# Patient Record
Sex: Female | Born: 1975 | Race: White | Hispanic: No | Smoking: Never smoker
Health system: Southern US, Community
[De-identification: ages and names within clinical notes are randomized; demographics above are authoritative.]

## PROBLEM LIST (undated history)

## (undated) DIAGNOSIS — F329 Major depressive disorder, single episode, unspecified: Secondary | ICD-10-CM

## (undated) DIAGNOSIS — J45909 Unspecified asthma, uncomplicated: Secondary | ICD-10-CM

## (undated) DIAGNOSIS — E282 Polycystic ovarian syndrome: Secondary | ICD-10-CM

## (undated) DIAGNOSIS — F32A Depression, unspecified: Secondary | ICD-10-CM

## (undated) DIAGNOSIS — M1990 Unspecified osteoarthritis, unspecified site: Secondary | ICD-10-CM

## (undated) DIAGNOSIS — G43909 Migraine, unspecified, not intractable, without status migrainosus: Secondary | ICD-10-CM

## (undated) DIAGNOSIS — K219 Gastro-esophageal reflux disease without esophagitis: Secondary | ICD-10-CM

## (undated) HISTORY — DX: Unspecified asthma, uncomplicated: J45.909

## (undated) HISTORY — PX: KIDNEY STONE SURGERY: SHX686

---

## 2003-03-07 ENCOUNTER — Inpatient Hospital Stay (HOSPITAL_COMMUNITY): Admission: EM | Admit: 2003-03-07 | Discharge: 2003-03-11 | Payer: Self-pay | Admitting: Psychiatry

## 2003-03-09 ENCOUNTER — Ambulatory Visit (HOSPITAL_COMMUNITY): Admission: RE | Admit: 2003-03-09 | Discharge: 2003-03-09 | Payer: Self-pay | Admitting: Psychiatry

## 2009-10-16 ENCOUNTER — Emergency Department (HOSPITAL_BASED_OUTPATIENT_CLINIC_OR_DEPARTMENT_OTHER): Admission: EM | Admit: 2009-10-16 | Discharge: 2009-10-16 | Payer: Self-pay | Admitting: Emergency Medicine

## 2009-12-24 ENCOUNTER — Ambulatory Visit: Payer: Self-pay | Admitting: Psychiatry

## 2009-12-24 ENCOUNTER — Inpatient Hospital Stay (HOSPITAL_COMMUNITY): Admission: AD | Admit: 2009-12-24 | Discharge: 2010-01-06 | Payer: Self-pay | Admitting: Psychiatry

## 2010-09-01 NOTE — Discharge Summary (Signed)
NAMEWHITNEY, HILLEGASS NO.:  [id number]   MEDICAL RECORD NO.:  [id number]                   PATIENT TYPE:  IPS   LOCATION:  0405                                 FACILITY:  [facility]   PHYSICIAN:  Jeanice Lim, M.D.              DATE OF BIRTH:  Nov 07, 1975   DATE OF ADMISSION:  03/07/2003  DATE OF DISCHARGE:  03/11/2003                                 DISCHARGE SUMMARY   IDENTIFYING DATA:  This is a 35 year old married Caucasian female  voluntarily admitted, presented with a history of depression and psychosis.  Questioning herself, feeling paranoid about coworkers, crying at work.  Took  a job she did not want.  Prior job was easier and less stressful.  Felt that  she was tape-recorded at work, thought people from work were in her home.  Showing paranoid, persecutory delusions, questionable hallucinations.   MEDICATIONS:  Lexapro (off for one week).   ALLERGIES:  PENICILLIN, ASPIRIN.   PHYSICAL EXAMINATION:  Essentially within normal limits.  Neurologically  nonfocal.   LABORATORY DATA:  Routine admission labs within normal limits.   MENTAL STATUS EXAM:  In bed, disheveled, cooperative, appearing ill.  Speech  clear.  Mood guilty, anxious, somewhat guarded.  Affect teary-eyed, flat.  Thought processes positive for paranoid ideation, ruminating over work  situation.  Cognitively intact.  Judgment and insight poor.   ADMISSION DIAGNOSES:   AXIS I:  Major depressive disorder with psychotic features.   AXIS II:  Deferred.   AXIS III:  1. History of possible brain tumor five years ago.  2. Questionable pituitary adenoma.   AXIS IV:  Moderate (problems with occupation and other psychosocial issues  and limited support system).   AXIS V:  25/60.   HOSPITAL COURSE:  The patient was admitted and ordered routine p.r.n.  medications and underwent further monitoring.  Was encouraged to participate  in individual, group and milieu therapy.  Was  monitored for safety.  This  was the first hospitalization.  History of severe depressive symptoms and  paranoid delusions, reporting positive voices and fleeting suicidal  ideation.  Had overdosed on Tylenol prior to kill herself in the past and  family history of bipolar disorder.  Geodon was optimized along with Zoloft,  targeting depressive and mood lability symptoms.  Ativan, low dose, p.r.n.  acute anxiety and activation.  The patient was stabilized on medications.  Reported doing well.  Mood more stable.  Less depressed.  No longer  delusional.  Decreased paranoid thoughts.  Reported no dangerous ideation.   CONDITION ON DISCHARGE:  Markedly improved.  Family session was held.  Husband was quite supportive and pleased with patient's progress.  The  patient showed an increase in insight and judgment regarding the importance  of her medications and improved reality testing by the time of discharge  with no dangerous ideation or overt psychotic symptoms.   DISCHARGE MEDICATIONS:  1. Geodon 60 mg b.i.d. with food.  2. Zoloft 50 mg q.a.m. x 3 days; 1-1/2 q.a.m. x 3 days and then 2 q.a.m.     with a titration schedule targeting 100 mg.  3. Ativan 0.5 mg, 1/2 three times a day.  4. Ambien 10 mg, 1 q.h.s. p.r.n. insomnia.   FOLLOW UP:  The patient was to follow up at the [facility]  on March 16, 2003 and at [facility]  with Betsey Amen on May 03, 2003 at 8 a.m.   DISCHARGE DIAGNOSES:   AXIS I:  Major depressive disorder with psychotic features.   AXIS II:  Deferred.   AXIS III:  1. History of possible brain tumor five years ago.  2. Questionable pituitary adenoma.   AXIS IV:  Moderate (problems with occupation and other psychosocial issues  and limited support system).   AXIS V:  Global Assessment of Functioning on discharge 55.                                               Jeanice Lim, M.D.    JEM/MEDQ  D:   04/04/2003  T:  04/04/2003  Job:  [id number]

## 2013-02-25 ENCOUNTER — Encounter (HOSPITAL_BASED_OUTPATIENT_CLINIC_OR_DEPARTMENT_OTHER): Payer: Self-pay | Admitting: Emergency Medicine

## 2013-02-25 ENCOUNTER — Emergency Department (HOSPITAL_BASED_OUTPATIENT_CLINIC_OR_DEPARTMENT_OTHER)
Admission: EM | Admit: 2013-02-25 | Discharge: 2013-02-25 | Disposition: A | Discharge status: Home / Self Care | Payer: No Typology Code available for payment source | Attending: Emergency Medicine | Admitting: Emergency Medicine

## 2013-02-25 DIAGNOSIS — G43909 Migraine, unspecified, not intractable, without status migrainosus: Secondary | ICD-10-CM | POA: Insufficient documentation

## 2013-02-25 DIAGNOSIS — Z880 Allergy status to penicillin: Secondary | ICD-10-CM | POA: Insufficient documentation

## 2013-02-25 DIAGNOSIS — Z3202 Encounter for pregnancy test, result negative: Secondary | ICD-10-CM | POA: Insufficient documentation

## 2013-02-25 DIAGNOSIS — M129 Arthropathy, unspecified: Secondary | ICD-10-CM | POA: Insufficient documentation

## 2013-02-25 DIAGNOSIS — N390 Urinary tract infection, site not specified: Secondary | ICD-10-CM | POA: Insufficient documentation

## 2013-02-25 DIAGNOSIS — F3289 Other specified depressive episodes: Secondary | ICD-10-CM | POA: Insufficient documentation

## 2013-02-25 DIAGNOSIS — Z79899 Other long term (current) drug therapy: Secondary | ICD-10-CM | POA: Insufficient documentation

## 2013-02-25 DIAGNOSIS — K219 Gastro-esophageal reflux disease without esophagitis: Secondary | ICD-10-CM | POA: Insufficient documentation

## 2013-02-25 DIAGNOSIS — F329 Major depressive disorder, single episode, unspecified: Secondary | ICD-10-CM | POA: Insufficient documentation

## 2013-02-25 HISTORY — DX: Depression, unspecified: F32.A

## 2013-02-25 HISTORY — DX: Major depressive disorder, single episode, unspecified: F32.9

## 2013-02-25 HISTORY — DX: Gastro-esophageal reflux disease without esophagitis: K21.9

## 2013-02-25 HISTORY — DX: Unspecified osteoarthritis, unspecified site: M19.90

## 2013-02-25 HISTORY — DX: Polycystic ovarian syndrome: E28.2

## 2013-02-25 HISTORY — DX: Migraine, unspecified, not intractable, without status migrainosus: G43.909

## 2013-02-25 LAB — COMPREHENSIVE METABOLIC PANEL
ALT: 45 U/L — ABNORMAL HIGH (ref 0–35)
AST: 35 U/L (ref 0–37)
Albumin: 4.1 g/dL (ref 3.5–5.2)
Alkaline Phosphatase: 118 U/L — ABNORMAL HIGH (ref 39–117)
BUN: 9 mg/dL (ref 6–23)
CO2: 23 mEq/L (ref 19–32)
Calcium: 9.4 mg/dL (ref 8.4–10.5)
Chloride: 102 mEq/L (ref 96–112)
Creatinine, Ser: 0.9 mg/dL (ref 0.50–1.10)
GFR calc Af Amer: >90 mL/min (ref >90)
GFR calc non Af Amer: 81 mL/min — ABNORMAL LOW (ref >90)
Glucose, Bld: 98 mg/dL (ref 70–99)
Potassium: 3.6 mEq/L (ref 3.5–5.1)
Sodium: 138 mEq/L (ref 135–145)
Total Bilirubin: 0.6 mg/dL (ref 0.3–1.2)
Total Protein: 8.2 g/dL (ref 6.0–8.3)

## 2013-02-25 LAB — CBC WITH DIFFERENTIAL/PLATELET
Basophils Absolute: 0 10*3/uL (ref 0.0–0.1)
Basophils Relative: 0 % (ref 0–1)
Eosinophils Absolute: 0.1 10*3/uL (ref 0.0–0.7)
Eosinophils Relative: 1 % (ref 0–5)
HCT: 42.8 % (ref 36.0–46.0)
Hemoglobin: 14.6 g/dL (ref 12.0–15.0)
Lymphocytes Relative: 23 % (ref 12–46)
Lymphs Abs: 1.8 10*3/uL (ref 0.7–4.0)
MCH: 31.7 pg (ref 26.0–34.0)
MCHC: 34.1 g/dL (ref 30.0–36.0)
MCV: 92.8 fL (ref 78.0–100.0)
Monocytes Absolute: 0.6 10*3/uL (ref 0.1–1.0)
Monocytes Relative: 9 % (ref 3–12)
Neutro Abs: 5 10*3/uL (ref 1.7–7.7)
Neutrophils Relative %: 67 % (ref 43–77)
Platelets: 283 10*3/uL (ref 150–400)
RBC: 4.61 MIL/uL (ref 3.87–5.11)
RDW: 13.2 % (ref 11.5–15.5)
WBC: 7.5 10*3/uL (ref 4.0–10.5)

## 2013-02-25 LAB — URINALYSIS, ROUTINE W REFLEX MICROSCOPIC
Bilirubin Urine: NEGATIVE
Glucose, UA: NEGATIVE mg/dL
Ketones, ur: NEGATIVE mg/dL
Nitrite: NEGATIVE
Protein, ur: 100 mg/dL — AB
Specific Gravity, Urine: 1.019 (ref 1.005–1.030)
Urobilinogen, UA: 0.2 mg/dL (ref 0.0–1.0)
pH: 6.5 (ref 5.0–8.0)

## 2013-02-25 LAB — URINE MICROSCOPIC-ADD ON

## 2013-02-25 LAB — PREGNANCY, URINE: Preg Test, Ur: NEGATIVE

## 2013-02-25 MED ORDER — CEPHALEXIN 500 MG PO CAPS: 500.0000 mg | ORAL_CAPSULE | Freq: Two times a day (BID) | ORAL | Status: DC

## 2013-02-25 MED ORDER — ONDANSETRON HCL 4 MG/2ML IJ SOLN
4.0000 mg | Freq: Once | INTRAMUSCULAR | Status: AC
  Administered 2013-02-25: 4 mg via INTRAVENOUS
  Filled 2013-02-25: qty 2

## 2013-02-25 MED ORDER — MORPHINE SULFATE 4 MG/ML IJ SOLN
4.0000 mg | Freq: Once | INTRAMUSCULAR | Status: AC
  Administered 2013-02-25: 4 mg via INTRAVENOUS
  Filled 2013-02-25: qty 1

## 2013-02-25 NOTE — ED Notes (Signed)
Pt c/o abd pain recent d/c from  [facility] last wed

## 2013-02-25 NOTE — ED Provider Notes (Signed)
Medical screening examination/treatment/procedure(s) were performed by non-physician practitioner and as supervising physician I was immediately available for consultation/collaboration.  EKG Interpretation   None         Charles B. Bernette Mayers, MD 02/25/13 Windell Moment

## 2013-02-25 NOTE — ED Provider Notes (Signed)
CSN: [id number]     Arrival date & time 02/25/13  1647 History   First MD Initiated Contact with Patient 02/25/13 1650     Chief Complaint  Patient presents with  . Abdominal Pain   (Consider location/radiation/quality/duration/timing/severity/associated sxs/prior Treatment) HPI Comments: Pt states that she was [facility] and was told that she has a hemorrhagic cyst on the right side:pt states that she thinks something is being missed so she decided to come here today:pt states that it also hurts when she urinates:no fever, vomiting and diarrhea  Patient is a 37 y.o. female presenting with abdominal pain. The history is provided by the patient. No language interpreter was used.  Abdominal Pain Pain location:  LLQ Pain quality: aching   Pain radiates to:  Does not radiate Pain severity:  Moderate Onset quality:  Gradual Timing:  Constant Progression:  Unchanged   Past Medical History  Diagnosis Date  . Depressed   . Arthritis   . Polycystic ovarian disease   . Migraine   . GERD (gastroesophageal reflux disease)    History reviewed. No pertinent past surgical history. History reviewed. No pertinent family history. History  Substance Use Topics  . Smoking status: Never Smoker   . Smokeless tobacco: Not on file  . Alcohol Use: No   OB History   Grav Para Term Preterm Abortions TAB SAB Ect Mult Living                 Review of Systems  Constitutional: Negative.   Respiratory: Negative.   Cardiovascular: Negative.   Gastrointestinal: Positive for abdominal pain.    Allergies  Penicillins  Home Medications   Current Outpatient Rx  Name  Route  Sig  Dispense  Refill  . atorvastatin (LIPITOR) 20 MG tablet   Oral   Take 20 mg by mouth daily.         . DULoxetine (CYMBALTA) 60 MG capsule   Oral   Take 60 mg by mouth daily.         . metFORMIN (GLUCOPHAGE) 500 MG tablet   Oral   Take by mouth 2 (two) times daily with a meal.          . midodrine (PROAMATINE) 2.5 MG tablet   Oral   Take 2.5 mg by mouth 3 (three) times daily with meals.         Marland Kitchen omeprazole (PRILOSEC) 40 MG capsule   Oral   Take 40 mg by mouth daily.         Marland Kitchen oxyCODONE-acetaminophen (PERCOCET/ROXICET) 5-325 MG per tablet   Oral   Take 1 tablet by mouth every 4 (four) hours as needed for severe pain.         Marland Kitchen sulfamethoxazole-trimethoprim (BACTRIM DS,SEPTRA DS) 800-160 MG per tablet   Oral   Take 1 tablet by mouth 2 (two) times daily.         Marland Kitchen topiramate (TOPAMAX) 50 MG tablet   Oral   Take 50 mg by mouth 2 (two) times daily.          BP 113/84  Pulse 125  Temp(Src) 98.3 F (36.8 C) (Oral)  Resp 18  SpO2 98%  LMP 02/09/2013 Physical Exam  Nursing note and vitals reviewed. Constitutional: She is oriented to person, place, and time. She appears well-developed and well-nourished.  HENT:  Head: Normocephalic and atraumatic.  Eyes: Conjunctivae and EOM are normal.  Neck: Neck supple.  Cardiovascular: Normal rate and regular rhythm.  Pulmonary/Chest: Effort normal and breath sounds normal.  Abdominal: Soft. Bowel sounds are normal. There is no tenderness.  Musculoskeletal: Normal range of motion.  Neurological: She is oriented to person, place, and time.  Skin: Skin is warm and dry.  Psychiatric: She has a normal mood and affect.    ED Course  Procedures (including critical care time) Labs Review Labs Reviewed  COMPREHENSIVE METABOLIC PANEL - Abnormal; Notable for the following:    ALT 45 (*)    Alkaline Phosphatase 118 (*)    GFR calc non Af Amer 81 (*)    All other components within normal limits  URINALYSIS, ROUTINE W REFLEX MICROSCOPIC - Abnormal; Notable for the following:    Color, Urine GREEN (*)    APPearance CLOUDY (*)    Hgb urine dipstick LARGE (*)    Protein, ur 100 (*)    Leukocytes, UA LARGE (*)    All other components within normal limits  URINE MICROSCOPIC-ADD ON - Abnormal; Notable for the  following:    Squamous Epithelial / LPF FEW (*)    Bacteria, UA FEW (*)    All other components within normal limits  URINE CULTURE  CBC WITH DIFFERENTIAL  PREGNANCY, URINE   Imaging Review No results found.  EKG Interpretation   None       MDM   1. UTI (lower urinary tract infection)      [facility]:std cultures negative:ct and ultrasound consistent with a right hemorrhagic cyst:abdomen is benign:pt was treat for uti for the last 5 days:will switch antibiotics and send for culture    Teressa Lower, NP 02/25/13 1822

## 2013-02-27 LAB — URINE CULTURE: Culture: >=100000

## 2013-03-01 ENCOUNTER — Telehealth (HOSPITAL_COMMUNITY): Payer: Self-pay | Admitting: Emergency Medicine

## 2013-03-01 ENCOUNTER — Telehealth (HOSPITAL_COMMUNITY): Payer: Self-pay

## 2013-03-01 NOTE — ED Notes (Signed)
Pt calling for results of urine cx.  ID verified.  Informed of dx and that tx prescribed is appropriate tx.

## 2013-03-01 NOTE — ED Notes (Signed)
Post ED Visit - Positive Culture Follow-up  Culture report reviewed by antimicrobial stewardship pharmacist: []  Wes Dulaney, Pharm.D., BCPS []  Celedonio Miyamoto, Pharm.D., BCPS []  Georgina Pillion, Pharm.D., BCPS []  [employer] [place], [address].D., BCPS, AAHIVP []  Estella Husk, Pharm.D., BCPS, AAHIVP [x]  Okey Regal, Pharm.D., BCPS  Positive urine culture Treated with Keflex, organism sensitive to the same and no further patient follow-up is required at this time.  [name] 03/01/2013, 1:19 PM

## 2013-03-05 ENCOUNTER — Emergency Department (HOSPITAL_BASED_OUTPATIENT_CLINIC_OR_DEPARTMENT_OTHER)
Admission: EM | Admit: 2013-03-05 | Discharge: 2013-03-05 | Disposition: A | Discharge status: Home / Self Care | Payer: No Typology Code available for payment source | Attending: Emergency Medicine | Admitting: Emergency Medicine

## 2013-03-05 ENCOUNTER — Encounter (HOSPITAL_BASED_OUTPATIENT_CLINIC_OR_DEPARTMENT_OTHER): Payer: Self-pay | Admitting: Emergency Medicine

## 2013-03-05 DIAGNOSIS — G43909 Migraine, unspecified, not intractable, without status migrainosus: Secondary | ICD-10-CM | POA: Insufficient documentation

## 2013-03-05 DIAGNOSIS — Z3202 Encounter for pregnancy test, result negative: Secondary | ICD-10-CM | POA: Insufficient documentation

## 2013-03-05 DIAGNOSIS — K219 Gastro-esophageal reflux disease without esophagitis: Secondary | ICD-10-CM | POA: Insufficient documentation

## 2013-03-05 DIAGNOSIS — Z79899 Other long term (current) drug therapy: Secondary | ICD-10-CM | POA: Insufficient documentation

## 2013-03-05 DIAGNOSIS — N390 Urinary tract infection, site not specified: Secondary | ICD-10-CM

## 2013-03-05 DIAGNOSIS — M129 Arthropathy, unspecified: Secondary | ICD-10-CM | POA: Insufficient documentation

## 2013-03-05 DIAGNOSIS — F329 Major depressive disorder, single episode, unspecified: Secondary | ICD-10-CM | POA: Insufficient documentation

## 2013-03-05 DIAGNOSIS — F3289 Other specified depressive episodes: Secondary | ICD-10-CM | POA: Insufficient documentation

## 2013-03-05 DIAGNOSIS — Z880 Allergy status to penicillin: Secondary | ICD-10-CM | POA: Insufficient documentation

## 2013-03-05 LAB — URINE MICROSCOPIC-ADD ON

## 2013-03-05 LAB — URINALYSIS, ROUTINE W REFLEX MICROSCOPIC
Bilirubin Urine: NEGATIVE
Glucose, UA: NEGATIVE mg/dL
Hgb urine dipstick: NEGATIVE
Ketones, ur: NEGATIVE mg/dL
Nitrite: NEGATIVE
Protein, ur: NEGATIVE mg/dL
Specific Gravity, Urine: 1.018 (ref 1.005–1.030)
Urobilinogen, UA: 0.2 mg/dL (ref 0.0–1.0)
pH: 7 (ref 5.0–8.0)

## 2013-03-05 LAB — PREGNANCY, URINE: Preg Test, Ur: NEGATIVE

## 2013-03-05 MED ORDER — NITROFURANTOIN MONOHYD MACRO 100 MG PO CAPS: 100.0000 mg | ORAL_CAPSULE | Freq: Two times a day (BID) | ORAL | Status: DC

## 2013-03-05 NOTE — ED Notes (Signed)
Dysuria x 3 weeks. She finished Keflex and symptoms are no better.

## 2013-03-05 NOTE — ED Provider Notes (Signed)
CSN: [id number]     Arrival date & time 03/05/13  1827 History   First MD Initiated Contact with Patient 03/05/13 1829     Chief Complaint  Patient presents with  . Dysuria   (Consider location/radiation/quality/duration/timing/severity/associated sxs/prior Treatment) HPI Comments: Pt states that she was seen on 11/12 and had a uti:pt states that the symptoms were getting better but she is having burning with urination:no abdominal pain, vag discharge, fever,n/v/d  Patient is a 37 y.o. female presenting with dysuria. The history is provided by the patient. No language interpreter was used.  Dysuria Pain quality:  Aching Pain severity:  Mild Onset quality:  Gradual Timing:  Constant Progression:  Partially resolved Chronicity:  Recurrent Recent urinary tract infections: yes   Relieved by:  Antibiotics   Past Medical History  Diagnosis Date  . Depressed   . Arthritis   . Polycystic ovarian disease   . Migraine   . GERD (gastroesophageal reflux disease)    History reviewed. No pertinent past surgical history. No family history on file. History  Substance Use Topics  . Smoking status: Never Smoker   . Smokeless tobacco: Not on file  . Alcohol Use: No   OB History   Grav Para Term Preterm Abortions TAB SAB Ect Mult Living                 Review of Systems  Constitutional: Negative.   Respiratory: Negative.   Cardiovascular: Negative.   Genitourinary: Positive for dysuria.    Allergies  Penicillins  Home Medications   Current Outpatient Rx  Name  Route  Sig  Dispense  Refill  . atorvastatin (LIPITOR) 20 MG tablet   Oral   Take 20 mg by mouth daily.         . cephALEXin (KEFLEX) 500 MG capsule   Oral   Take 1 capsule (500 mg total) by mouth 2 (two) times daily.   14 capsule   0   . DULoxetine (CYMBALTA) 60 MG capsule   Oral   Take 60 mg by mouth daily.         . metFORMIN (GLUCOPHAGE) 500 MG tablet   Oral   Take by mouth 2 (two) times daily with  a meal.         . midodrine (PROAMATINE) 2.5 MG tablet   Oral   Take 2.5 mg by mouth 3 (three) times daily with meals.         Marland Kitchen omeprazole (PRILOSEC) 40 MG capsule   Oral   Take 40 mg by mouth daily.         Marland Kitchen oxyCODONE-acetaminophen (PERCOCET/ROXICET) 5-325 MG per tablet   Oral   Take 1 tablet by mouth every 4 (four) hours as needed for severe pain.         Marland Kitchen sulfamethoxazole-trimethoprim (BACTRIM DS,SEPTRA DS) 800-160 MG per tablet   Oral   Take 1 tablet by mouth 2 (two) times daily.         Marland Kitchen topiramate (TOPAMAX) 50 MG tablet   Oral   Take 50 mg by mouth 2 (two) times daily.          BP 103/76  Pulse 92  Temp(Src) 98.6 F (37 C) (Oral)  Resp 20  Ht 5' 6.5" (1.689 m)  Wt 266 lb (120.657 kg)  BMI 42.30 kg/m2  SpO2 98%  LMP 02/09/2013 Physical Exam  Nursing note and vitals reviewed. Constitutional: She appears well-developed and well-nourished.  HENT:  Head: Normocephalic.  Cardiovascular:  Normal rate and regular rhythm.   Pulmonary/Chest: Effort normal and breath sounds normal.  Abdominal: Soft. Bowel sounds are normal. There is no tenderness.    ED Course  Procedures (including critical care time) Labs Review Labs Reviewed  URINALYSIS, ROUTINE W REFLEX MICROSCOPIC - Abnormal; Notable for the following:    APPearance CLOUDY (*)    Leukocytes, UA LARGE (*)    All other components within normal limits  URINE CULTURE  URINE MICROSCOPIC-ADD ON  PREGNANCY, URINE   Imaging Review No results found.  EKG Interpretation   None       MDM   1. UTI (lower urinary tract infection)    Will treat with macrobid as not completely resolved:urine sent for culture:vital stable    Teressa Lower, NP 03/05/13 1913

## 2013-03-06 LAB — URINE CULTURE: Colony Count: 5000

## 2013-03-06 NOTE — ED Provider Notes (Signed)
Medical screening examination/treatment/procedure(s) were performed by non-physician practitioner and as supervising physician I was immediately available for consultation/collaboration.  EKG Interpretation   None         Glynn Octave, MD 03/06/13 [phone]

## 2013-03-11 ENCOUNTER — Encounter: Payer: Self-pay | Admitting: *Deleted

## 2013-03-19 ENCOUNTER — Emergency Department (HOSPITAL_BASED_OUTPATIENT_CLINIC_OR_DEPARTMENT_OTHER)
Admission: EM | Admit: 2013-03-19 | Discharge: 2013-03-19 | Disposition: A | Discharge status: Home / Self Care | Payer: No Typology Code available for payment source | Attending: Emergency Medicine | Admitting: Emergency Medicine

## 2013-03-19 ENCOUNTER — Encounter (HOSPITAL_BASED_OUTPATIENT_CLINIC_OR_DEPARTMENT_OTHER): Payer: Self-pay | Admitting: Emergency Medicine

## 2013-03-19 DIAGNOSIS — Z79899 Other long term (current) drug therapy: Secondary | ICD-10-CM | POA: Insufficient documentation

## 2013-03-19 DIAGNOSIS — E282 Polycystic ovarian syndrome: Secondary | ICD-10-CM | POA: Insufficient documentation

## 2013-03-19 DIAGNOSIS — N390 Urinary tract infection, site not specified: Secondary | ICD-10-CM | POA: Insufficient documentation

## 2013-03-19 DIAGNOSIS — G43909 Migraine, unspecified, not intractable, without status migrainosus: Secondary | ICD-10-CM | POA: Insufficient documentation

## 2013-03-19 DIAGNOSIS — K219 Gastro-esophageal reflux disease without esophagitis: Secondary | ICD-10-CM | POA: Insufficient documentation

## 2013-03-19 DIAGNOSIS — Z792 Long term (current) use of antibiotics: Secondary | ICD-10-CM | POA: Insufficient documentation

## 2013-03-19 DIAGNOSIS — F329 Major depressive disorder, single episode, unspecified: Secondary | ICD-10-CM | POA: Insufficient documentation

## 2013-03-19 DIAGNOSIS — F3289 Other specified depressive episodes: Secondary | ICD-10-CM | POA: Insufficient documentation

## 2013-03-19 DIAGNOSIS — M129 Arthropathy, unspecified: Secondary | ICD-10-CM | POA: Insufficient documentation

## 2013-03-19 DIAGNOSIS — Z3202 Encounter for pregnancy test, result negative: Secondary | ICD-10-CM | POA: Insufficient documentation

## 2013-03-19 DIAGNOSIS — N201 Calculus of ureter: Secondary | ICD-10-CM | POA: Insufficient documentation

## 2013-03-19 DIAGNOSIS — Z880 Allergy status to penicillin: Secondary | ICD-10-CM | POA: Insufficient documentation

## 2013-03-19 LAB — URINE MICROSCOPIC-ADD ON

## 2013-03-19 LAB — URINALYSIS, ROUTINE W REFLEX MICROSCOPIC
Glucose, UA: NEGATIVE mg/dL
Ketones, ur: 15 mg/dL — AB
Nitrite: NEGATIVE
Protein, ur: NEGATIVE mg/dL
Specific Gravity, Urine: 1.026 (ref 1.005–1.030)
Urobilinogen, UA: 1 mg/dL (ref 0.0–1.0)
pH: 6 (ref 5.0–8.0)

## 2013-03-19 LAB — PREGNANCY, URINE: Preg Test, Ur: NEGATIVE

## 2013-03-19 MED ORDER — CEPHALEXIN 500 MG PO CAPS: 500.0000 mg | ORAL_CAPSULE | Freq: Four times a day (QID) | ORAL | Status: DC

## 2013-03-19 MED ORDER — ONDANSETRON HCL 4 MG/2ML IJ SOLN
4.0000 mg | Freq: Once | INTRAMUSCULAR | Status: AC
  Administered 2013-03-19: 4 mg via INTRAVENOUS
  Filled 2013-03-19: qty 2

## 2013-03-19 MED ORDER — HYDROMORPHONE HCL PF 1 MG/ML IJ SOLN
1.0000 mg | Freq: Once | INTRAMUSCULAR | Status: AC
  Administered 2013-03-19: 1 mg via INTRAVENOUS
  Filled 2013-03-19: qty 1

## 2013-03-19 MED ORDER — SODIUM CHLORIDE 0.9 % IV BOLUS (SEPSIS)
500.0000 mL | Freq: Once | INTRAVENOUS | Status: AC
  Administered 2013-03-19: 500 mL via INTRAVENOUS

## 2013-03-19 MED ORDER — KETOROLAC TROMETHAMINE 30 MG/ML IJ SOLN
30.0000 mg | Freq: Once | INTRAMUSCULAR | Status: AC
  Administered 2013-03-19: 30 mg via INTRAVENOUS
  Filled 2013-03-19: qty 1

## 2013-03-19 NOTE — ED Notes (Signed)
PA at bedside.

## 2013-03-19 NOTE — ED Provider Notes (Signed)
CSN: [id number]     Arrival date & time 03/19/13  1900 History   First MD Initiated Contact with Patient 03/19/13 1903     Chief Complaint  Patient presents with  . Abdominal Pain   (Consider location/radiation/quality/duration/timing/severity/associated sxs/prior Treatment) HPI Comments: Pt states that she was seen earlier today [facility] and diagnosed with a stone:pt states that she didn't get her medications filled because she can't afford them and so she is here for pain control:pt denies fever or vomiting  The history is provided by the patient. No language interpreter was used.    Past Medical History  Diagnosis Date  . Depressed   . Arthritis   . Polycystic ovarian disease   . Migraine   . GERD (gastroesophageal reflux disease)    History reviewed. No pertinent past surgical history. No family history on file. History  Substance Use Topics  . Smoking status: Never Smoker   . Smokeless tobacco: Not on file  . Alcohol Use: No   OB History   Grav Para Term Preterm Abortions TAB SAB Ect Mult Living                 Review of Systems  Constitutional: Negative.   Respiratory: Negative.   Cardiovascular: Negative.     Allergies  Penicillins  Home Medications   Current Outpatient Rx  Name  Route  Sig  Dispense  Refill  . atorvastatin (LIPITOR) 20 MG tablet   Oral   Take 20 mg by mouth daily.         . cephALEXin (KEFLEX) 500 MG capsule   Oral   Take 1 capsule (500 mg total) by mouth 2 (two) times daily.   14 capsule   0   . DULoxetine (CYMBALTA) 60 MG capsule   Oral   Take 60 mg by mouth daily.         . metFORMIN (GLUCOPHAGE) 500 MG tablet   Oral   Take by mouth 2 (two) times daily with a meal.         . midodrine (PROAMATINE) 2.5 MG tablet   Oral   Take 2.5 mg by mouth 3 (three) times daily with meals.         . nitrofurantoin, macrocrystal-monohydrate, (MACROBID) 100 MG capsule   Oral   Take 1 capsule (100 mg total) by mouth 2 (two)  times daily.   14 capsule   0   . omeprazole (PRILOSEC) 40 MG capsule   Oral   Take 40 mg by mouth daily.         Marland Kitchen oxyCODONE-acetaminophen (PERCOCET/ROXICET) 5-325 MG per tablet   Oral   Take 1 tablet by mouth every 4 (four) hours as needed for severe pain.         Marland Kitchen sulfamethoxazole-trimethoprim (BACTRIM DS,SEPTRA DS) 800-160 MG per tablet   Oral   Take 1 tablet by mouth 2 (two) times daily.         Marland Kitchen topiramate (TOPAMAX) 50 MG tablet   Oral   Take 50 mg by mouth 2 (two) times daily.          BP 118/86  Pulse 113  Temp(Src) 97.4 F (36.3 C) (Oral)  Resp 20  Ht 5\' 6"  (1.676 m)  Wt 259 lb (117.482 kg)  BMI 41.82 kg/m2  SpO2 95%  LMP 02/09/2013 Physical Exam  Nursing note and vitals reviewed. Constitutional: She is oriented to person, place, and time. She appears well-developed and well-nourished.  Cardiovascular: Normal  rate and regular rhythm.   Pulmonary/Chest: Effort normal and breath sounds normal.  Abdominal: Soft. Bowel sounds are normal. There is no tenderness.  Musculoskeletal: Normal range of motion.  Neurological: She is alert and oriented to person, place, and time.  Skin: Skin is warm and dry.  Psychiatric: She has a normal mood and affect.    ED Course  Procedures (including critical care time) Labs Review Labs Reviewed  URINALYSIS, ROUTINE W REFLEX MICROSCOPIC - Abnormal; Notable for the following:    Color, Urine AMBER (*)    APPearance CLOUDY (*)    Hgb urine dipstick LARGE (*)    Bilirubin Urine SMALL (*)    Ketones, ur 15 (*)    Leukocytes, UA MODERATE (*)    All other components within normal limits  URINE MICROSCOPIC-ADD ON - Abnormal; Notable for the following:    Bacteria, UA FEW (*)    All other components within normal limits  URINE CULTURE  PREGNANCY, URINE   Imaging Review No results found.  EKG Interpretation   None       MDM   1. Ureteral stone   2. UTI (lower urinary tract infection)      [facility]  [facility]:pt has a 5mm stone otherwise work up was pretty unremarkable:pt vitals are stable here:pt is okay to follow up with urology on the 10th as planned:pt has a script for percocet at home:pt very upset because she states that she can't afford her percocet so she needs to be admitted    Teressa Lower, NP 03/19/13 2145

## 2013-03-19 NOTE — ED Notes (Signed)
Abdominal pain. Was seen in the ED in [facility] this am. She was told she has a kidney stone. Was not able to get her Rx's filled.

## 2013-03-20 LAB — URINE CULTURE: Colony Count: 30000

## 2013-03-20 NOTE — ED Provider Notes (Signed)
Medical screening examination/treatment/procedure(s) were performed by non-physician practitioner and as supervising physician I was immediately available for consultation/collaboration.  EKG Interpretation   None         Yudith Norlander N Jaedyn Marrufo, DO 03/20/13 0012 

## 2013-04-23 ENCOUNTER — Ambulatory Visit: Payer: No Typology Code available for payment source | Admitting: Obstetrics & Gynecology

## 2013-04-23 DIAGNOSIS — N200 Calculus of kidney: Secondary | ICD-10-CM | POA: Insufficient documentation

## 2013-04-24 ENCOUNTER — Ambulatory Visit (HOSPITAL_COMMUNITY)
Admission: RE | Admit: 2013-04-24 | Discharge: 2013-04-24 | Disposition: A | Discharge status: Home / Self Care | Payer: No Typology Code available for payment source | Source: Ambulatory Visit | Attending: Obstetrics & Gynecology | Admitting: Obstetrics & Gynecology

## 2013-04-24 DIAGNOSIS — N83209 Unspecified ovarian cyst, unspecified side: Secondary | ICD-10-CM | POA: Insufficient documentation

## 2013-04-24 DIAGNOSIS — R1031 Right lower quadrant pain: Secondary | ICD-10-CM | POA: Insufficient documentation

## 2013-04-24 DIAGNOSIS — Z30431 Encounter for routine checking of intrauterine contraceptive device: Secondary | ICD-10-CM | POA: Insufficient documentation

## 2013-04-27 ENCOUNTER — Ambulatory Visit (INDEPENDENT_AMBULATORY_CARE_PROVIDER_SITE_OTHER): Payer: No Typology Code available for payment source | Admitting: Obstetrics & Gynecology

## 2013-04-27 ENCOUNTER — Encounter: Payer: Self-pay | Admitting: Obstetrics & Gynecology

## 2013-04-27 DIAGNOSIS — A499 Bacterial infection, unspecified: Secondary | ICD-10-CM

## 2013-04-27 DIAGNOSIS — Z87440 Personal history of urinary (tract) infections: Secondary | ICD-10-CM

## 2013-04-27 DIAGNOSIS — G8929 Other chronic pain: Secondary | ICD-10-CM

## 2013-04-27 DIAGNOSIS — N200 Calculus of kidney: Secondary | ICD-10-CM

## 2013-04-27 DIAGNOSIS — N949 Unspecified condition associated with female genital organs and menstrual cycle: Secondary | ICD-10-CM

## 2013-04-27 DIAGNOSIS — B9689 Other specified bacterial agents as the cause of diseases classified elsewhere: Secondary | ICD-10-CM

## 2013-04-27 DIAGNOSIS — R102 Pelvic and perineal pain: Principal | ICD-10-CM

## 2013-04-27 DIAGNOSIS — N760 Acute vaginitis: Secondary | ICD-10-CM

## 2013-04-27 DIAGNOSIS — N83209 Unspecified ovarian cyst, unspecified side: Secondary | ICD-10-CM | POA: Insufficient documentation

## 2013-04-27 NOTE — Progress Notes (Signed)
Faculty Practice OB/GYN Attending Phone Call Note  Patient is supposed to be seen today for right ovarian cyst noted on imaging in 02/2013.  My recommendation was for patient to get a recent ultrasound and be evaluated on 04/27/13.  Patient was called, plan discussed, she agrees with plan. Will follow up results and discuss with patient during her 04/27/13 visit.  Jaynie CollinsUGONNA  Tully Burgo, MD, FACOG Attending Obstetrician & Gynecologist Faculty Practice, [facility]

## 2013-04-27 NOTE — Progress Notes (Signed)
Pt. Was told she had a hemorrhagic cyst in late October or November and they told pt. To be seen here. C/o of extreme pain RLQ. C/o of frequent UTI and kidney stones.

## 2013-04-27 NOTE — Progress Notes (Signed)
   CLINIC ENCOUNTER NOTE  History:  38 y.o. P3 here today for evaluation of R ovarian hemorrhagic cyst noted on CT scan in 02/2014.  She has a long history of pelvic pain, mostly stemming from recurrent UTIs and kidney stones.  Had ureteral stent placed 04/15/13, will be removed this week.  Pain is mostly anterior. She is accompanied by her friend.   The following portions of the patient's history were reviewed and updated as appropriate: allergies, current medications, past family history, past medical history, past social history, past surgical history and problem list. Normal paps, last one in 2013.  Review of Systems:  Pertinent items are noted in HPI.  Objective:  Physical Exam There were no vitals taken for this visit. Gen: NAD Abd: Soft, obese, nondistended, +moderate suprapubic tenderness, no rebound/guarding Pelvic: Normal appearing external genitalia; normal appearing vaginal mucosa and cervix.  Yellow discharge seen, samples obtained.  Small uterus, no other palpable masses, no uterine or adnexal tenderness.  Moderate anterior vaginal tenderness elicited on exam.  Labs and Imaging  04/24/2013   CLINICAL DATA:  Right lower quadrant pain. History of a Right ovarian cyst  EXAM: TRANSABDOMINAL AND TRANSVAGINAL ULTRASOUND OF PELVIS  TECHNIQUE: Both transabdominal and transvaginal ultrasound examinations of the pelvis were performed. Transabdominal technique was performed for global imaging of the pelvis including uterus, ovaries, adnexal regions, and pelvic cul-de-sac. It was necessary to proceed with endovaginal exam following the transabdominal exam to visualize the endometrium.  COMPARISON:  02/16/2013  FINDINGS: Uterus  Measurements: 8.6 mild 4.6 x 5.9 cm. No fibroids or other mass visualized. An intrauterine device is appreciated which appears to be adequately positioned.  Endometrium  Thickness: N/A.  No focal abnormality visualized.  Right ovary  Measurements: 3.2 x 2.3 x 2.6 cm.  Normal appearance/no adnexal mass. The previous described cystic mass involving the right ovary has involuted in the interim. Small follicles are identified.  Left ovary  Measurements: 3.6 x 2.1 x 2.2 cm. Normal appearance/no adnexal mass.  Other findings  No free fluid.  IMPRESSION: Involution of the previously described right ovarian cyst. Otherwise unremarkable pelvic ultrasound.   Electronically Signed   By: Salome HolmesHector  Cooper M.D.   On: 04/24/2013 16:05    Assessment & Plan:  Resolved ovarian cyst, patient reassured Anterior vaginal tenderness is likely secondary to bladder inflammation, patient to follow up with Urologist for further evaluation Told to return with any gynecologic symptoms Routine preventative health maintenance measures emphasized.    Jaynie CollinsUGONNA  Aryia Delira, MD, FACOG Attending Obstetrician & Gynecologist Faculty Practice, [facility]

## 2013-04-27 NOTE — Patient Instructions (Signed)
Return to clinic for any scheduled appointments or for any gynecologic concerns as needed.   

## 2013-04-28 ENCOUNTER — Telehealth: Payer: Self-pay

## 2013-04-28 LAB — WET PREP, GENITAL
Trich, Wet Prep: NONE SEEN
YEAST WET PREP: NONE SEEN

## 2013-04-28 LAB — GC/CHLAMYDIA PROBE AMP
CT PROBE, AMP APTIMA: NEGATIVE
GC Probe RNA: NEGATIVE

## 2013-04-28 MED ORDER — METRONIDAZOLE 500 MG PO TABS: 500.0000 mg | ORAL_TABLET | Freq: Two times a day (BID) | ORAL | Status: AC

## 2013-04-28 NOTE — Telephone Encounter (Signed)
Message copied by Louanna RawAMPBELL, Cailan General M on Tue Apr 28, 2013  9:18 AM ------      Message from: Jaynie CollinsANYANWU, UGONNA A      Created: Tue Apr 28, 2013  8:33 AM       Wet prep showed clue cells consistent with BV, Metronidazole e-prescribed. Please call to inform patient of results and advise her to pick up prescription. ------

## 2013-04-28 NOTE — Telephone Encounter (Signed)
Called pt. And left message on house phone stating we are calling with results please call clinic.

## 2013-04-28 NOTE — Addendum Note (Signed)
Addended by: Jaynie CollinsANYANWU, Robertha Staples A on: 04/28/2013 08:33 AM   Modules accepted: Orders

## 2013-04-28 NOTE — Telephone Encounter (Signed)
Called pt and informed her of +BV requiring medication treatment. She may pick up her medication today. Pt's questions were answered to her satisfaction and she voiced understanding.

## 2013-05-06 ENCOUNTER — Encounter: Payer: Self-pay | Admitting: *Deleted

## 2013-08-22 ENCOUNTER — Encounter (HOSPITAL_BASED_OUTPATIENT_CLINIC_OR_DEPARTMENT_OTHER): Payer: Self-pay | Admitting: Emergency Medicine

## 2013-08-22 ENCOUNTER — Emergency Department (HOSPITAL_BASED_OUTPATIENT_CLINIC_OR_DEPARTMENT_OTHER)
Admission: EM | Admit: 2013-08-22 | Discharge: 2013-08-22 | Disposition: A | Discharge status: Home / Self Care | Payer: 59 | Attending: Emergency Medicine | Admitting: Emergency Medicine

## 2013-08-22 ENCOUNTER — Emergency Department (HOSPITAL_BASED_OUTPATIENT_CLINIC_OR_DEPARTMENT_OTHER): Payer: 59

## 2013-08-22 DIAGNOSIS — M129 Arthropathy, unspecified: Secondary | ICD-10-CM | POA: Insufficient documentation

## 2013-08-22 DIAGNOSIS — K219 Gastro-esophageal reflux disease without esophagitis: Secondary | ICD-10-CM | POA: Insufficient documentation

## 2013-08-22 DIAGNOSIS — M25569 Pain in unspecified knee: Secondary | ICD-10-CM | POA: Insufficient documentation

## 2013-08-22 DIAGNOSIS — Z8639 Personal history of other endocrine, nutritional and metabolic disease: Secondary | ICD-10-CM | POA: Insufficient documentation

## 2013-08-22 DIAGNOSIS — F3289 Other specified depressive episodes: Secondary | ICD-10-CM | POA: Insufficient documentation

## 2013-08-22 DIAGNOSIS — G43909 Migraine, unspecified, not intractable, without status migrainosus: Secondary | ICD-10-CM | POA: Insufficient documentation

## 2013-08-22 DIAGNOSIS — F329 Major depressive disorder, single episode, unspecified: Secondary | ICD-10-CM | POA: Insufficient documentation

## 2013-08-22 DIAGNOSIS — Z792 Long term (current) use of antibiotics: Secondary | ICD-10-CM | POA: Insufficient documentation

## 2013-08-22 DIAGNOSIS — M25561 Pain in right knee: Secondary | ICD-10-CM

## 2013-08-22 DIAGNOSIS — Z862 Personal history of diseases of the blood and blood-forming organs and certain disorders involving the immune mechanism: Secondary | ICD-10-CM | POA: Insufficient documentation

## 2013-08-22 DIAGNOSIS — M25562 Pain in left knee: Secondary | ICD-10-CM

## 2013-08-22 DIAGNOSIS — Z79899 Other long term (current) drug therapy: Secondary | ICD-10-CM | POA: Insufficient documentation

## 2013-08-22 DIAGNOSIS — Z880 Allergy status to penicillin: Secondary | ICD-10-CM | POA: Insufficient documentation

## 2013-08-22 MED ORDER — HYDROCODONE-ACETAMINOPHEN 5-325 MG PO TABS
1.0000 | ORAL_TABLET | Freq: Once | ORAL | Status: AC
  Administered 2013-08-22: 1 via ORAL
  Filled 2013-08-22: qty 1

## 2013-08-22 MED ORDER — MELOXICAM 7.5 MG PO TABS: 7.5000 mg | ORAL_TABLET | Freq: Every day | ORAL | Status: AC

## 2013-08-22 MED ORDER — HYDROCODONE-ACETAMINOPHEN 5-325 MG PO TABS: 1.0000 | ORAL_TABLET | ORAL | Status: AC | PRN

## 2013-08-22 NOTE — ED Notes (Signed)
Arthritis in her knees. Recently pain worse, this morning had a difficult time getting out of bed. Moved office last week and recent gardening as well.

## 2013-08-22 NOTE — Discharge Instructions (Signed)
Arthralgia °Your caregiver has diagnosed you as suffering from an arthralgia. Arthralgia means there is pain in a joint. This can come from many reasons including: °· Bruising the joint which causes soreness (inflammation) in the joint. °· Wear and tear on the joints which occur as we grow older (osteoarthritis). °· Overusing the joint. °· Various forms of arthritis. °· Infections of the joint. °Regardless of the cause of pain in your joint, most of these different pains respond to anti-inflammatory drugs and rest. The exception to this is when a joint is infected, and these cases are treated with antibiotics, if it is a bacterial infection. °HOME CARE INSTRUCTIONS  °· Rest the injured area for as long as directed by your caregiver. Then slowly start using the joint as directed by your caregiver and as the pain allows. Crutches as directed may be useful if the ankles, knees or hips are involved. If the knee was splinted or casted, continue use and care as directed. If an stretchy or elastic wrapping bandage has been applied today, it should be removed and re-applied every 3 to 4 hours. It should not be applied tightly, but firmly enough to keep swelling down. Watch toes and feet for swelling, bluish discoloration, coldness, numbness or excessive pain. If any of these problems (symptoms) occur, remove the ace bandage and re-apply more loosely. If these symptoms persist, contact your caregiver or return to this location. °· For the first 24 hours, keep the injured extremity elevated on pillows while lying down. °· Apply ice for 15-20 minutes to the sore joint every couple hours while awake for the first half day. Then 03-04 times per day for the first 48 hours. Put the ice in a plastic bag and place a towel between the bag of ice and your skin. °· Wear any splinting, casting, elastic bandage applications, or slings as instructed. °· Only take over-the-counter or prescription medicines for pain, discomfort, or fever as  directed by your caregiver. Do not use aspirin immediately after the injury unless instructed by your physician. Aspirin can cause increased bleeding and bruising of the tissues. °· If you were given crutches, continue to use them as instructed and do not resume weight bearing on the sore joint until instructed. °Persistent pain and inability to use the sore joint as directed for more than 2 to 3 days are warning signs indicating that you should see a caregiver for a follow-up visit as soon as possible. Initially, a hairline fracture (break in bone) may not be evident on X-rays. Persistent pain and swelling indicate that further evaluation, non-weight bearing or use of the joint (use of crutches or slings as instructed), or further X-rays are indicated. X-rays may sometimes not show a small fracture until a week or 10 days later. Make a follow-up appointment with your own caregiver or one to whom we have referred you. A radiologist (specialist in reading X-rays) may read your X-rays. Make sure you know how you are to obtain your X-ray results. Do not assume everything is normal if you do not hear from us. °SEEK MEDICAL CARE IF: °Bruising, swelling, or pain increases. °SEEK IMMEDIATE MEDICAL CARE IF:  °· Your fingers or toes are numb or blue. °· The pain is not responding to medications and continues to stay the same or get worse. °· The pain in your joint becomes severe. °· You develop a fever over 102° F (38.9° C). °· It becomes impossible to move or use the joint. °MAKE SURE YOU:  °·   Understand these instructions. °· Will watch your condition. °· Will get help right away if you are not doing well or get worse. °Document Released: 04/02/2005 Document Revised: 06/25/2011 Document Reviewed: 11/19/2007 °[employer]® Patient Information ©2014 [employer]. ° °Cryotherapy °Cryotherapy means treatment with cold. Ice or gel packs can be used to reduce both pain and swelling. Ice is the most helpful within the first 24 to 48  hours after an injury or flareup from overusing a muscle or joint. Sprains, strains, spasms, burning pain, shooting pain, and aches can all be eased with ice. Ice can also be used when recovering from surgery. Ice is effective, has very few side effects, and is safe for most people to use. °PRECAUTIONS  °Ice is not a safe treatment option for people with: °· Raynaud's phenomenon. This is a condition affecting small blood vessels in the extremities. Exposure to cold may cause your problems to return. °· Cold hypersensitivity. There are many forms of cold hypersensitivity, including: °· Cold urticaria. Red, itchy hives appear on the skin when the tissues begin to warm after being iced. °· Cold erythema. This is a red, itchy rash caused by exposure to cold. °· Cold hemoglobinuria. Red blood cells break down when the tissues begin to warm after being iced. The hemoglobin that carry oxygen are passed into the urine because they cannot combine with blood proteins fast enough. °· Numbness or altered sensitivity in the area being iced. °If you have any of the following conditions, do not use ice until you have discussed cryotherapy with your caregiver: °· Heart conditions, such as arrhythmia, angina, or chronic heart disease. °· High blood pressure. °· Healing wounds or open skin in the area being iced. °· Current infections. °· Rheumatoid arthritis. °· Poor circulation. °· Diabetes. °Ice slows the blood flow in the region it is applied. This is beneficial when trying to stop inflamed tissues from spreading irritating chemicals to surrounding tissues. However, if you expose your skin to cold temperatures for too long or without the proper protection, you can damage your skin or nerves. Watch for signs of skin damage due to cold. °HOME CARE INSTRUCTIONS °Follow these tips to use ice and cold packs safely. °· Place a dry or damp towel between the ice and skin. A damp towel will cool the skin more quickly, so you may need to  shorten the time that the ice is used. °· For a more rapid response, add gentle compression to the ice. °· Ice for no more than 10 to 20 minutes at a time. The bonier the area you are icing, the less time it will take to get the benefits of ice. °· Check your skin after 5 minutes to make sure there are no signs of a poor response to cold or skin damage. °· Rest 20 minutes or more in between uses. °· Once your skin is numb, you can end your treatment. You can test numbness by very lightly touching your skin. The touch should be so light that you do not see the skin dimple from the pressure of your fingertip. When using ice, most people will feel these normal sensations in this order: cold, burning, aching, and numbness. °· Do not use ice on someone who cannot communicate their responses to pain, such as small children or people with dementia. °HOW TO MAKE AN ICE PACK °Ice packs are the most common way to use ice therapy. Other methods include ice massage, ice baths, and cryo-sprays. Muscle creams that cause a   cold, tingly feeling do not offer the same benefits that ice offers and should not be used as a substitute unless recommended by your caregiver. °To make an ice pack, do one of the following: °· Place crushed ice or a bag of frozen vegetables in a sealable plastic bag. Squeeze out the excess air. Place this bag inside another plastic bag. Slide the bag into a pillowcase or place a damp towel between your skin and the bag. °· Mix 3 parts water with 1 part rubbing alcohol. Freeze the mixture in a sealable plastic bag. When you remove the mixture from the freezer, it will be slushy. Squeeze out the excess air. Place this bag inside another plastic bag. Slide the bag into a pillowcase or place a damp towel between your skin and the bag. °SEEK MEDICAL CARE IF: °· You develop white spots on your skin. This may give the skin a blotchy (mottled) appearance. °· Your skin turns blue or pale. °· Your skin becomes waxy or  hard. °· Your swelling gets worse. °MAKE SURE YOU:  °· Understand these instructions. °· Will watch your condition. °· Will get help right away if you are not doing well or get worse. °Document Released: 11/27/2010 Document Revised: 06/25/2011 Document Reviewed: 11/27/2010 °[employer]® Patient Information ©2014 [employer]. ° °

## 2013-08-22 NOTE — ED Notes (Signed)
Patient transported to X-ray 

## 2013-08-22 NOTE — ED Provider Notes (Signed)
CSN: [id number]     Arrival date & time 08/22/13  1432 History   First MD Initiated Contact with Patient 08/22/13 1514     Chief Complaint  Patient presents with  . Knee Pain     (Consider location/radiation/quality/duration/timing/severity/associated sxs/prior Treatment) Patient is a 38 y.o. female presenting with knee pain. The history is provided by the patient. No language interpreter was used.  Knee Pain Location:  Knee Knee location:  L knee and R knee Pain details:    Quality:  Aching Associated symptoms: no fever   Associated symptoms comment:  She complains of progressive pain in bilateral knees without swelling. No redness or fever. She denies direct injury. She reports a 25-pound weight loss without change in knee pain. She presents today because the pain is keeping her from usual activity.   Past Medical History  Diagnosis Date  . Depressed   . Arthritis   . Polycystic ovarian disease   . Migraine   . GERD (gastroesophageal reflux disease)    Past Surgical History  Procedure Laterality Date  . Kidney stone surgery     No family history on file. History  Substance Use Topics  . Smoking status: Never Smoker   . Smokeless tobacco: Never Used  . Alcohol Use: No   OB History   Grav Para Term Preterm Abortions TAB SAB Ect Mult Living                 Review of Systems  Constitutional: Negative for fever and chills.  HENT: Negative.   Respiratory: Negative.   Cardiovascular: Negative.   Gastrointestinal: Negative.   Musculoskeletal:       See HPI.  Skin: Negative.   Neurological: Negative.       Allergies  Aspirin and Penicillins  Home Medications   Prior to Admission medications   Medication Sig Start Date End Date Taking? Authorizing Provider  gabapentin (NEURONTIN) 100 MG capsule Take 100 mg by mouth 3 (three) times daily.   Yes Historical Provider, MD  atorvastatin (LIPITOR) 20 MG tablet Take 20 mg by mouth daily.    Historical Provider, MD   DULoxetine (CYMBALTA) 60 MG capsule Take 60 mg by mouth daily.    Historical Provider, MD  metFORMIN (GLUCOPHAGE) 500 MG tablet Take by mouth 2 (two) times daily with a meal.    Historical Provider, MD  midodrine (PROAMATINE) 2.5 MG tablet Take 2.5 mg by mouth 3 (three) times daily with meals.    Historical Provider, MD  omeprazole (PRILOSEC) 40 MG capsule Take 40 mg by mouth daily.    Historical Provider, MD  oxybutynin (DITROPAN-XL) 5 MG 24 hr tablet Take 5 mg by mouth at bedtime.    Historical Provider, MD  oxyCODONE-acetaminophen (PERCOCET/ROXICET) 5-325 MG per tablet Take 1 tablet by mouth every 4 (four) hours as needed for severe pain.    Historical Provider, MD  sulfamethoxazole-trimethoprim (BACTRIM DS,SEPTRA DS) 800-160 MG per tablet Take 1 tablet by mouth 2 (two) times daily.    Historical Provider, MD  tamsulosin (FLOMAX) 0.4 MG CAPS capsule Take 0.4 mg by mouth.    Historical Provider, MD  topiramate (TOPAMAX) 50 MG tablet Take 50 mg by mouth 2 (two) times daily.    Historical Provider, MD   BP 134/84  Pulse 124  Temp(Src) 98.9 F (37.2 C) (Oral)  Resp 20  Ht 5\' 6"  (1.676 m)  Wt 255 lb (115.667 kg)  BMI 41.18 kg/m2  SpO2 100%  LMP 08/22/2013 Physical Exam  Constitutional: She is oriented to person, place, and time. She appears well-developed and well-nourished. No distress.  Cardiovascular: Intact distal pulses.   Musculoskeletal:  Knees bilaterally unremarkable in appearance. No redness. Joints stable without laxity. FROM, fully weight bearing.   Neurological: She is alert and oriented to person, place, and time.  Skin: Skin is warm and dry. No erythema.  Psychiatric: She has a normal mood and affect.    ED Course  Procedures (including critical care time) Labs Review Labs Reviewed - No data to display  Imaging Review Dg Knee 2 Views Left  08/22/2013   CLINICAL DATA:  Patient states arthritis in both knees, with worse pain this morning, pain is below patella and  behind patella  EXAM: LEFT KNEE - 1-2 VIEW  COMPARISON:  None.  FINDINGS: There is no evidence of fracture, dislocation, or joint effusion. There is no evidence of arthropathy or other focal bone abnormality. Soft tissues are unremarkable.  IMPRESSION: Negative.   Electronically Signed   By: Esperanza Heiraymond  Rubner M.D.   On: 08/22/2013 16:21   Dg Knee 2 Views Right  08/22/2013   CLINICAL DATA:  Patient states arthritis in both knees with worse pain this morning below patella  EXAM: RIGHT KNEE - 1-2 VIEW  COMPARISON:  None.  FINDINGS: There is no evidence of fracture, dislocation, or joint effusion. There is no evidence of arthropathy or other focal bone abnormality. Soft tissues are unremarkable.  IMPRESSION: Negative.   Electronically Signed   By: Esperanza Heiraymond  Rubner M.D.   On: 08/22/2013 16:21     EKG Interpretation None      MDM   Final diagnoses:  None    1. Knee pain  X-rays done at patient request and are negative. Pain control given, referral to orthopedics.    Arnoldo HookerShari A Bassheva Flury, PA-C 08/22/13 1707

## 2013-08-23 NOTE — ED Provider Notes (Signed)
Medical screening examination/treatment/procedure(s) were performed by non-physician practitioner and as supervising physician I was immediately available for consultation/collaboration.   EKG Interpretation None        Charles B. Sheldon, MD 08/23/13 1657 

## 2014-01-31 IMAGING — US US TRANSVAGINAL NON-OB
1 series · 13 of 25 positions shown · non-contrast
Comparison: 02/16/2013

CLINICAL DATA: Right lower quadrant pain. History of a Right
ovarian cyst



[Series 1: us pelvis complete · 32 acquisitions, 13 frames shown]
[im 1/32]
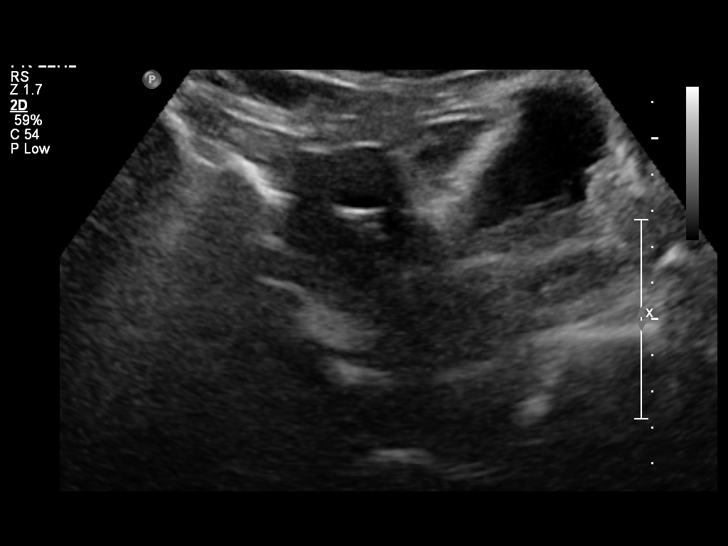
[im 3/32]
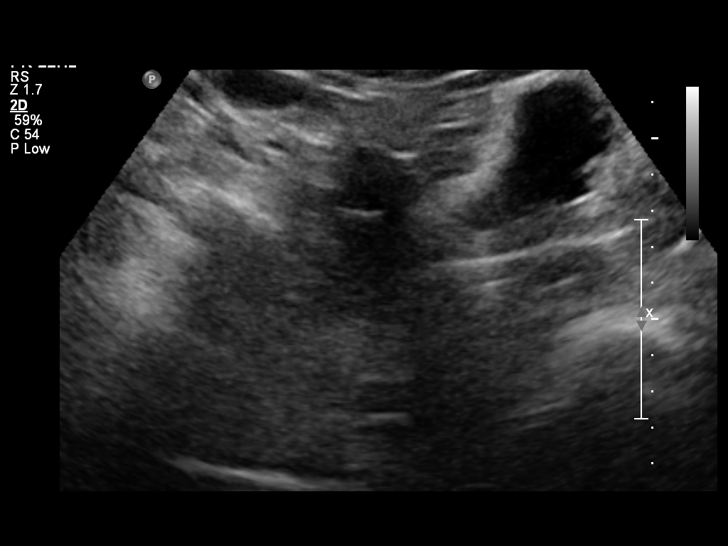
[im 6/32]
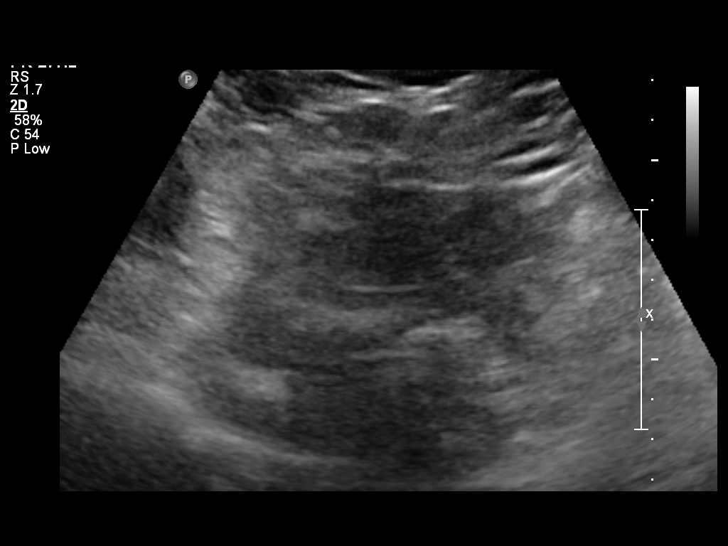
[im 8/32]
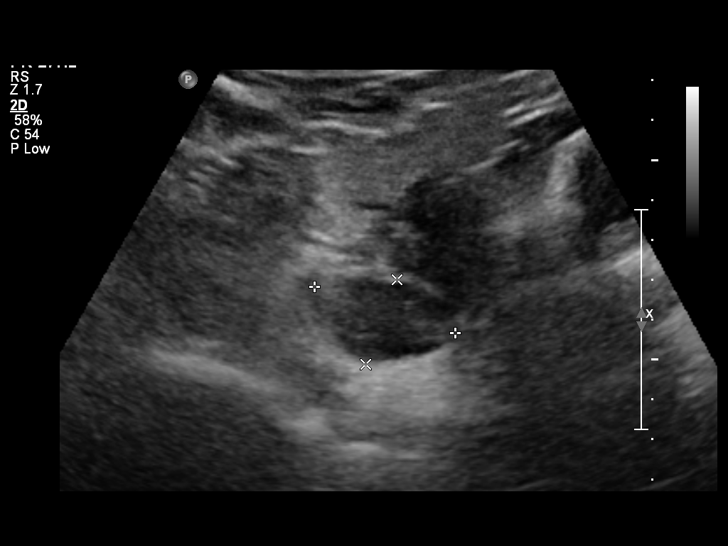
[im 11/32]
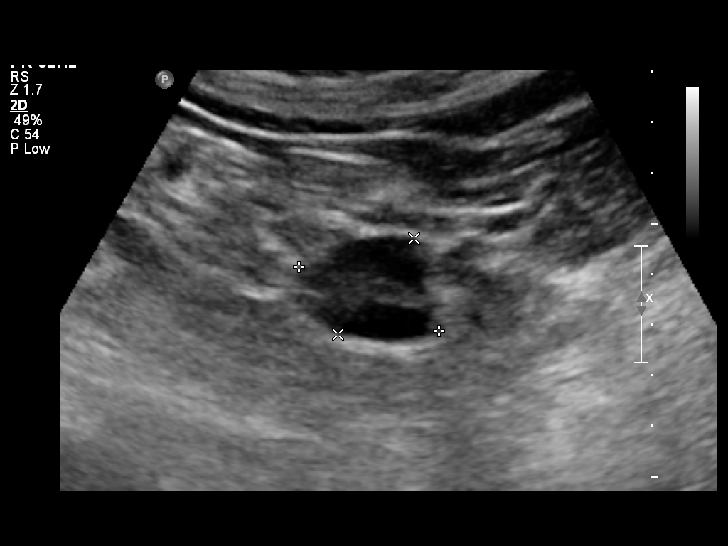
[im 13/32]
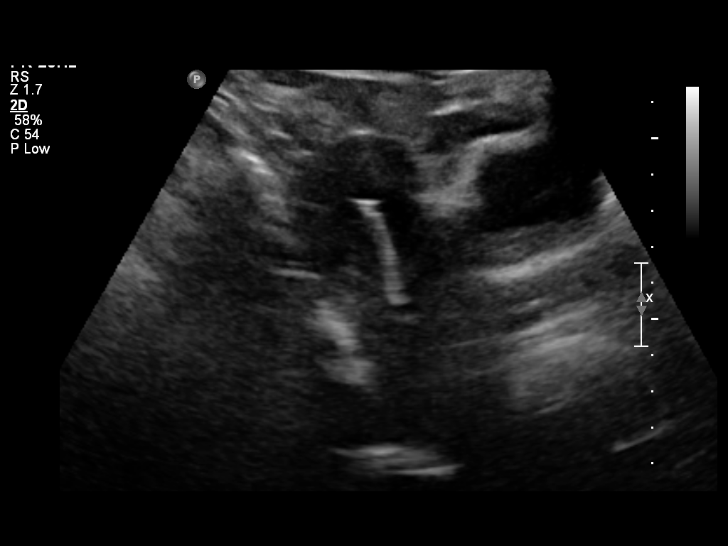
[im 16/32]
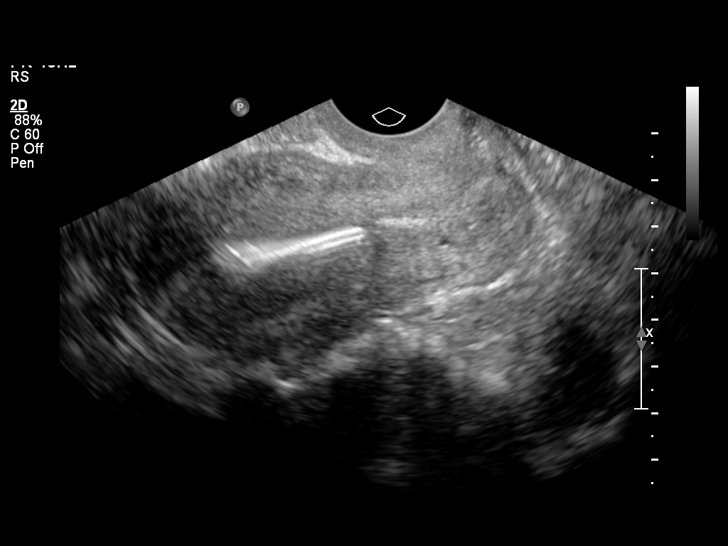
[im 19/32]
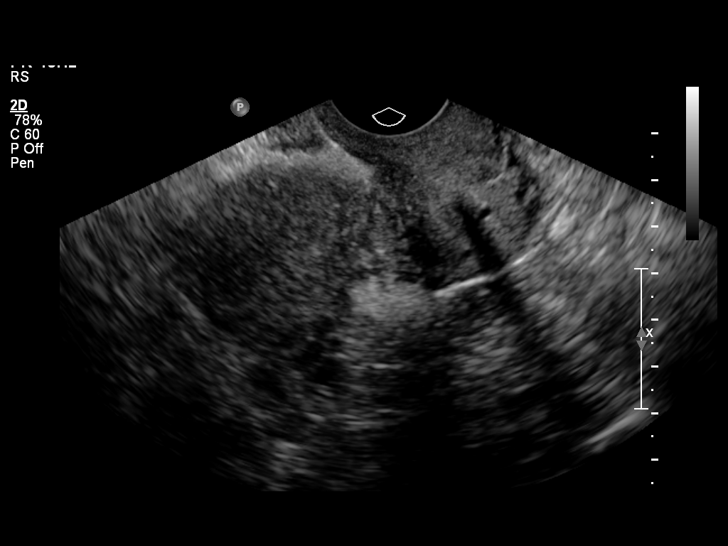
[im 21/32]
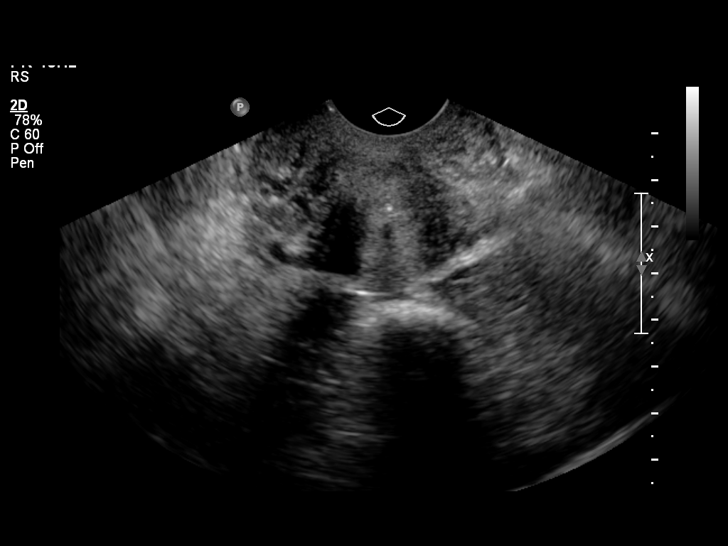
[im 24/32]
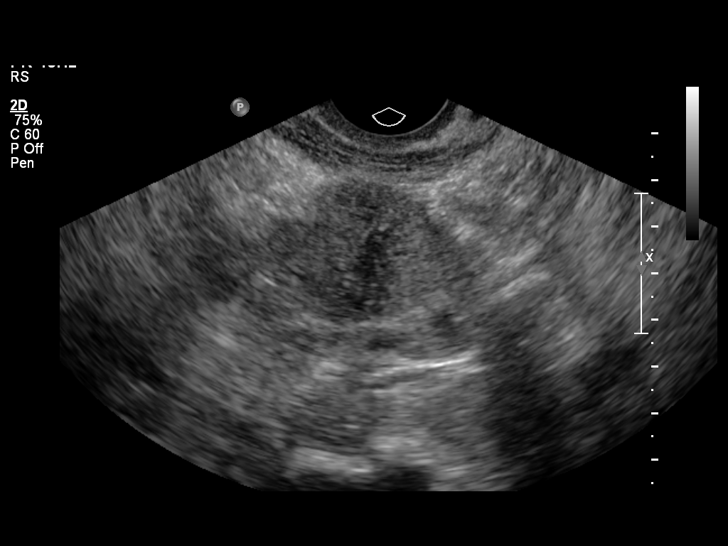
[im 26/32]
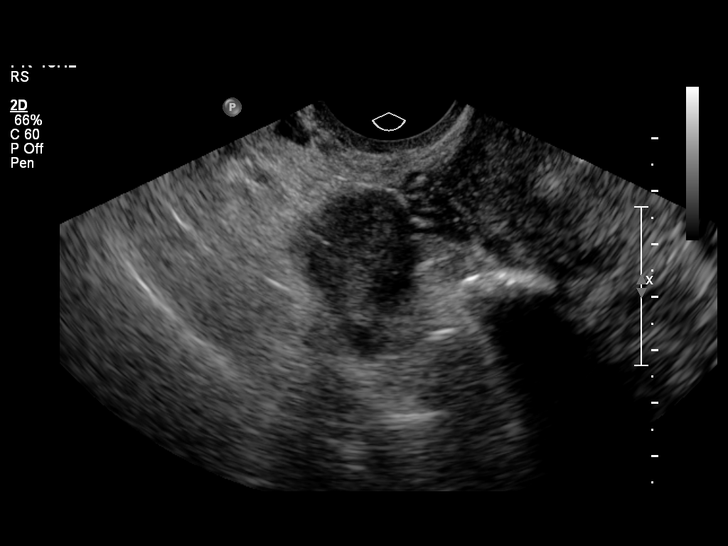
[im 29/32]
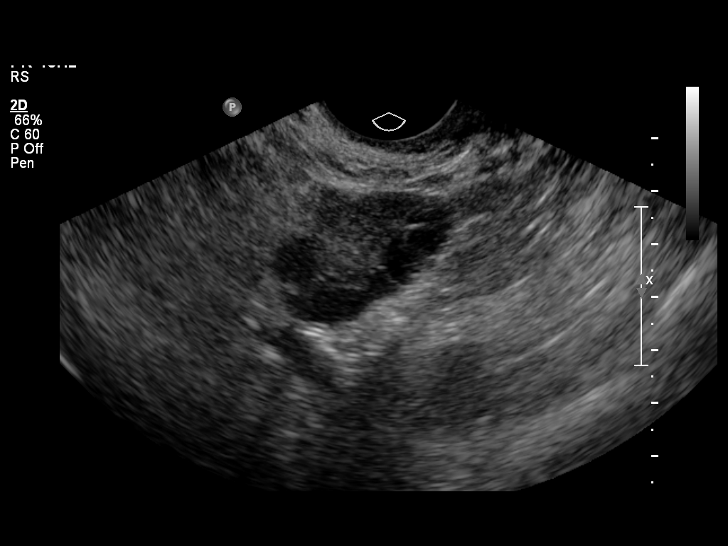
[im 32/32]
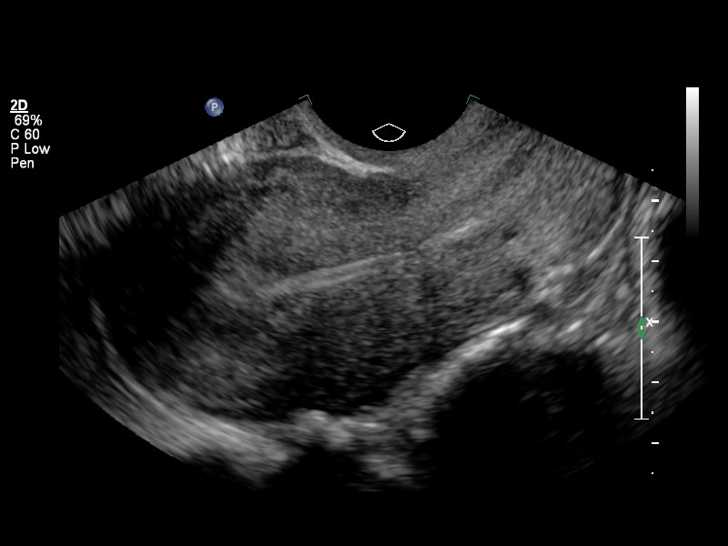

[13 of 25 positions shown; findings below may reference images not displayed]

FINDINGS: Uterus

Measurements: 8.6 mild 4.6 x 5.9 cm. No fibroids or other mass
visualized. An intrauterine device is appreciated which appears to
be adequately positioned.

Endometrium

Thickness: N/A.  No focal abnormality visualized.

Right ovary

Measurements: 3.2 x 2.3 x 2.6 cm. Normal appearance/no adnexal mass.
The previous described cystic mass involving the right ovary has
involuted in the interim. Small follicles are identified.

Left ovary

Measurements: 3.6 x 2.1 x 2.2 cm. Normal appearance/no adnexal mass.

Other findings

No free fluid.
IMPRESSION: Involution of the previously described right ovarian cyst. Otherwise
unremarkable pelvic ultrasound.

## 2015-03-03 ENCOUNTER — Ambulatory Visit: Payer: 59 | Admitting: Pediatrics
